# Patient Record
Sex: Male | Born: 1960 | Race: White | Hispanic: No | Marital: Married | Smoking: Never smoker
Health system: Southern US, Community
[De-identification: ages and names within clinical notes are randomized; demographics above are authoritative.]

---

## 2015-09-29 ENCOUNTER — Encounter: Payer: Self-pay | Admitting: Emergency Medicine

## 2015-09-29 ENCOUNTER — Ambulatory Visit (INDEPENDENT_AMBULATORY_CARE_PROVIDER_SITE_OTHER): Payer: Self-pay

## 2015-09-29 ENCOUNTER — Ambulatory Visit
Admission: EM | Admit: 2015-09-29 | Discharge: 2015-09-29 | Disposition: A | Discharge status: Home / Self Care | Payer: Self-pay | Attending: Family Medicine | Admitting: Family Medicine

## 2015-09-29 DIAGNOSIS — S93402A Sprain of unspecified ligament of left ankle, initial encounter: Secondary | ICD-10-CM

## 2015-09-29 NOTE — Discharge Instructions (Signed)
Follow up with orthopedic as discussed as needed for continued pain. Return to Urgent care for new or worsening concerns.   Ankle Sprain An ankle sprain is an injury to the strong, fibrous tissues (ligaments) that hold the bones of your ankle joint together.  CAUSES An ankle sprain is usually caused by a fall or by twisting your ankle. Ankle sprains most commonly occur when you step on the outer edge of your foot, and your ankle turns inward. People who participate in sports are more prone to these types of injuries.  SYMPTOMS   Pain in your ankle. The pain may be present at rest or only when you are trying to stand or walk.  Swelling.  Bruising. Bruising may develop immediately or within 1 to 2 days after your injury.  Difficulty standing or walking, particularly when turning corners or changing directions. DIAGNOSIS  Your caregiver will ask you details about your injury and perform a physical exam of your ankle to determine if you have an ankle sprain. During the physical exam, your caregiver will press on and apply pressure to specific areas of your foot and ankle. Your caregiver will try to move your ankle in certain ways. An X-ray exam may be done to be sure a bone was not broken or a ligament did not separate from one of the bones in your ankle (avulsion fracture).  TREATMENT  Certain types of braces can help stabilize your ankle. Your caregiver can make a recommendation for this. Your caregiver may recommend the use of medicine for pain. If your sprain is severe, your caregiver may refer you to a surgeon who helps to restore function to parts of your skeletal system (orthopedist) or a physical therapist. HOME CARE INSTRUCTIONS   Apply ice to your injury for 1-2 days or as directed by your caregiver. Applying ice helps to reduce inflammation and pain.  Put ice in a plastic bag.  Place a towel between your skin and the bag.  Leave the ice on for 15-20 minutes at a time, every 2 hours  while you are awake.  Only take over-the-counter or prescription medicines for pain, discomfort, or fever as directed by your caregiver.  Elevate your injured ankle above the level of your heart as much as possible for 2-3 days.  If your caregiver recommends crutches, use them as instructed. Gradually put weight on the affected ankle. Continue to use crutches or a cane until you can walk without feeling pain in your ankle.  If you have a plaster splint, wear the splint as directed by your caregiver. Do not rest it on anything harder than a pillow for the first 24 hours. Do not put weight on it. Do not get it wet. You may take it off to take a shower or bath.  You may have been given an elastic bandage to wear around your ankle to provide support. If the elastic bandage is too tight (you have numbness or tingling in your foot or your foot becomes cold and blue), adjust the bandage to make it comfortable.  If you have an air splint, you may blow more air into it or let air out to make it more comfortable. You may take your splint off at night and before taking a shower or bath. Wiggle your toes in the splint several times per day to decrease swelling. SEEK MEDICAL CARE IF:   You have rapidly increasing bruising or swelling.  Your toes feel extremely cold or you lose feeling in  your foot.  Your pain is not relieved with medicine. SEEK IMMEDIATE MEDICAL CARE IF:  Your toes are numb or blue.  You have severe pain that is increasing. MAKE SURE YOU:   Understand these instructions.  Will watch your condition.  Will get help right away if you are not doing well or get worse.   This information is not intended to replace advice given to you by your health care provider. Make sure you discuss any questions you have with your health care provider.   Document Released: 10/02/2005 Document Revised: 10/23/2014 Document Reviewed: 10/14/2011 Elsevier Interactive Patient Education [employer].

## 2015-09-29 NOTE — ED Provider Notes (Signed)
[facility]  ____________________________________________  Time seen: Approximately 8:54 PM  I have reviewed the triage vital signs and the nursing notes.   HISTORY  Chief Complaint Ankle Pain   HPI Frank Floyd is a 54 y.o. male  presents for the complaint of left lateral ankle pain times the last 6 days. Patient reports that 6 days ago he was jogging and stepped off the side of the road causing him to roll his left ankle. Patient reports that he was able to catch himself and avoid other injuries. Denies head injury or loss consciousness. Reports continued left ankle pain since. States current left ankle pain is much improved than initial pain after injury. States however continues with some swelling and bruising with intermittent pain. States pain is only when directly pressed or when rolling ankle. Denies pain with walking normally.  States current left ankle pain is 3 out of 10. Denies pain radiation. Denies other pain or injury. He does report that he had a left fibula fracture as a child. Denies need for pain medication.   History reviewed. No pertinent past medical history.  There are no active problems to display for this patient.   History reviewed. No pertinent past surgical history.  No current outpatient prescriptions on file.  Allergies Review of patient's allergies indicates no known allergies.  History reviewed. No pertinent family history.  Social History Social History  Substance Use Topics  . Smoking status: Never Smoker   . Smokeless tobacco: None  . Alcohol Use: Yes    Review of Systems Constitutional: No fever/chills Eyes: No visual changes. ENT: No sore throat. Cardiovascular: Denies chest pain. Respiratory: Denies shortness of breath. Gastrointestinal: No abdominal pain.  No nausea, no vomiting.  No diarrhea.  No constipation. Genitourinary: Negative for dysuria. Musculoskeletal: Negative for back pain. positive left ankle pain   Skin: Negative for rash. Neurological: Negative for headaches, focal weakness or numbness.  10-point ROS otherwise negative.  ____________________________________________   PHYSICAL EXAM:  VITAL SIGNS: ED Triage Vitals  Enc Vitals Group     BP 09/29/15 1953 115/74 mmHg     Pulse Rate 09/29/15 1953 71     Resp -- 18     Temp 09/29/15 1953 97.9 F (36.6 C)     Temp Source 09/29/15 1953 Oral     SpO2 09/29/15 1953 99 %     Weight 09/29/15 1953 162 lb (73.483 kg)     Height 09/29/15 1953  (1.778 m)     Head Cir --      Peak Flow --      Pain Score 09/29/15 1955 6     Pain Loc --      Pain Edu? --      Excl. in GC? --     Constitutional: Alert and oriented. Well appearing and in no acute distress. Eyes: Conjunctivae are normal. PERRL. EOMI. Head: Atraumatic.  Nose: No congestion/rhinnorhea.  Mouth/Throat: Mucous membranes are moist Neck: No stridor.  No cervical spine tenderness to palpation. Cardiovascular: Normal rate, regular rhythm. Grossly normal heart sounds.  Good peripheral circulation. Respiratory: Normal respiratory effort.  No retractions. Lungs CTAB. Musculoskeletal: No lower or upper extremity tenderness nor edema. Bilateral pedal pulses equal and easily palpated.  Except:  mild left lateral malleolus and mild anterior left ankle tenderness to palpation, minimal swelling, mild lower lateral foot ecchymosis that is nontender to palpation,  full range of motion present, minimal pain with ankle rotation, full range of motion present. Left  foot nontender. Left lower extremity otherwise nontender. No pain with dorsiflexion or plantarflexion. No calf tenderness bilaterally. Steady gait.     Neurologic:  Normal speech and language. No gross focal neurologic deficits are appreciated. No gait instability. Skin:  Skin is warm, dry and intact. No rash noted. Psychiatric: Mood and affect are normal. Speech and behavior are normal.   ____________________________________________   LABS (all labs ordered are listed, but only abnormal results are displayed)  Labs Reviewed - No data to display  RADIOLOGY  EXAM: LEFT ANKLE COMPLETE - 3+ VIEW  COMPARISON: None.  FINDINGS: Frontal, oblique, and lateral views were obtained. There is evidence of old trauma involving the distal fibular region with benign-appearing periosteal reaction along the distal fibula consistent with remodeling. There is no acute fracture or joint effusion. The ankle mortise appears intact. There is no appreciable joint space narrowing.  IMPRESSION: Old trauma distal fibula with bony remodeling in this area. No acute fracture. Mortise intact.   Electronically Signed By: Bretta BangWilliam Woodruff III M.D. On: 09/29/2015 20:16          DG Foot Complete Left (Final result) Result time: 09/29/15 20:16:56   Final result by Rad Results In Interface (09/29/15 20:16:56)   Narrative:   CLINICAL DATA: Initial encounter for Pt was running 6 days ago and stumbled lost balance and fell injuring left ankle and foot. Most pain with bruising along the base of 4th and 5th MT bone. bruise also lower fibula area just above and ant to lat malleolus. Old frac.*comment was truncated*  EXAM: LEFT FOOT - COMPLETE 3+ VIEW  COMPARISON: None  FINDINGS: No acute fracture or dislocation. Remote posttraumatic deformity involving the distal fibula.  IMPRESSION: No acute osseous abnormality.   Electronically Signed By: Jeronimo GreavesKyle Talbot M.D. On: 09/29/2015 20:16      I, Renford DillsLindsey Manjot Beumer, personally viewed and evaluated these images (plain radiographs) as part of my medical decision making.   ____________________________________________   PROCEDURES  Procedure(s) performed:  Denies need for/ refused crutches or splint.   INITIAL IMPRESSION / ASSESSMENT AND PLAN / ED COURSE  Pertinent labs & imaging results that were available during my  care of the patient were reviewed by me and considered in my medical decision making (see chart for details).   Very well-appearing patient. No acute distress. Ambulatory in room. Presents for complaints of left lateral ankle pain 6 days post after accidentally rolling ankle while jogging. Denies head injury or loss of consciousness. Denies other injury. Reports has remained ambulatory. States ankle pain overall has improved over the last 6 days however since it still has some pain he wanted to evaluate and to rule out fracture.  Left ankle and left foot x-rays reviewed. Per radiology no acute osseous abnormality, old trauma distal fibula with bony remodeling. Full range of motion present. Suspect strain injury. Counseled regarding rest, ice. Patient denies need for crutches or splint. Patient denies need for pain medications. Information for orthopedic Dr. Joice LoftsPoggi given, for patient to follow-up as needed for continued pain.   Discussed follow up with Primary care physician this week. Discussed follow up and return parameters including no resolution or any worsening concerns. Patient verbalized understanding and agreed to plan.   ____________________________________________   FINAL CLINICAL IMPRESSION(S) / ED DIAGNOSES  Final diagnoses:  Left ankle sprain, initial encounter       Renford DillsLindsey Elizah Lydon, NP 09/29/15 2110

## 2015-09-29 NOTE — ED Notes (Signed)
Pt was jogging and twisted left ankle and fell, six days ago. Pt able to ambulate, bruises to ankle.  Concerned for fracture.

## 2019-09-17 ENCOUNTER — Other Ambulatory Visit: Payer: Self-pay | Admitting: Family Medicine

## 2019-09-17 ENCOUNTER — Ambulatory Visit
Admission: RE | Admit: 2019-09-17 | Discharge: 2019-09-17 | Disposition: A | Discharge status: Home / Self Care | Payer: Managed Care, Other (non HMO) | Attending: Family Medicine | Admitting: Family Medicine

## 2019-09-17 ENCOUNTER — Ambulatory Visit
Admission: RE | Admit: 2019-09-17 | Discharge: 2019-09-17 | Disposition: A | Discharge status: Home / Self Care | Payer: Managed Care, Other (non HMO) | Source: Ambulatory Visit | Attending: Family Medicine | Admitting: Family Medicine

## 2019-09-17 ENCOUNTER — Other Ambulatory Visit: Payer: Self-pay

## 2019-09-17 DIAGNOSIS — M25521 Pain in right elbow: Secondary | ICD-10-CM

## 2019-09-17 DIAGNOSIS — G8929 Other chronic pain: Secondary | ICD-10-CM

## 2019-11-26 ENCOUNTER — Other Ambulatory Visit: Payer: Self-pay | Admitting: Neurosurgery

## 2019-11-26 DIAGNOSIS — M4324 Fusion of spine, thoracic region: Secondary | ICD-10-CM

## 2019-12-30 ENCOUNTER — Ambulatory Visit: Payer: Managed Care, Other (non HMO)

## 2020-12-01 ENCOUNTER — Other Ambulatory Visit: Payer: Self-pay | Admitting: Neurosurgery

## 2020-12-01 ENCOUNTER — Other Ambulatory Visit (HOSPITAL_COMMUNITY): Payer: Self-pay | Admitting: Neurosurgery

## 2020-12-01 DIAGNOSIS — M4324 Fusion of spine, thoracic region: Secondary | ICD-10-CM

## 2021-07-10 IMAGING — CR DG ELBOW 2V*R*
2 series · 2 of 2 positions shown · non-contrast
Comparison: None.

CLINICAL DATA: Chronic right elbow pain medial side 1 year. No
injury.

EXAM:
RIGHT ELBOW - 2 VIEW

[elbow ap]
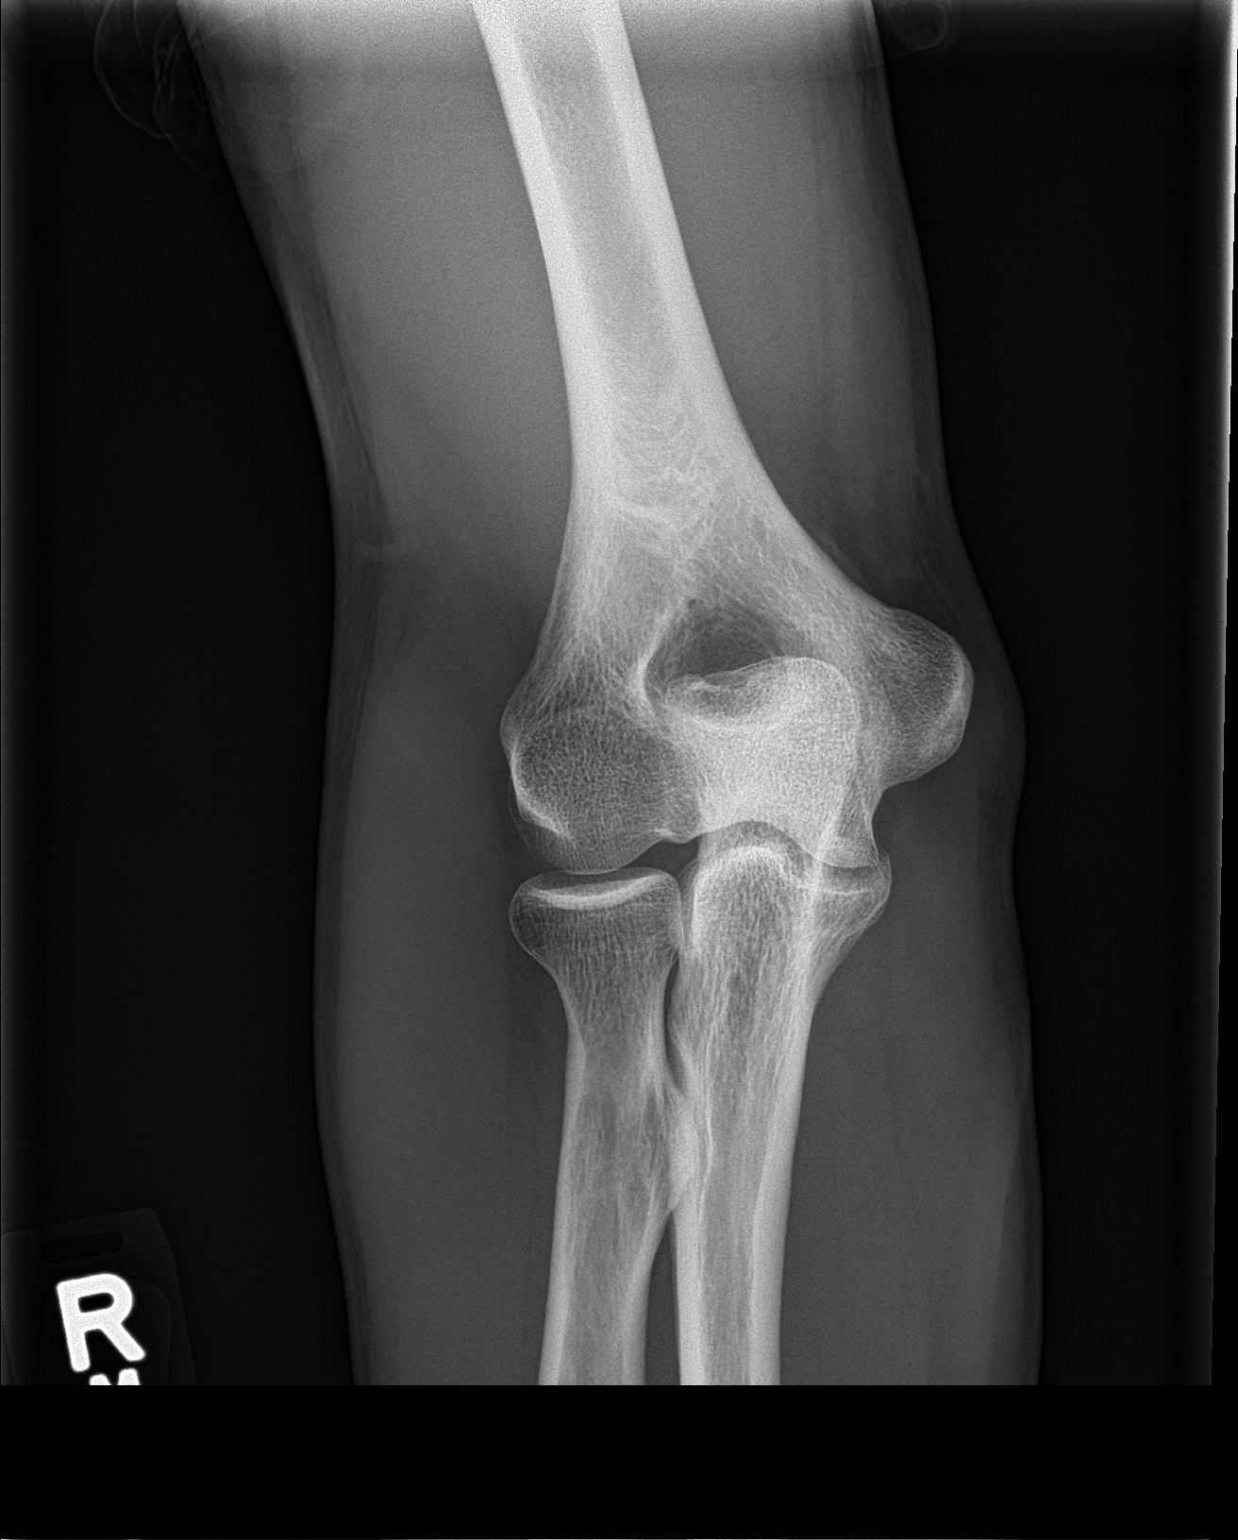

[elbow lat]
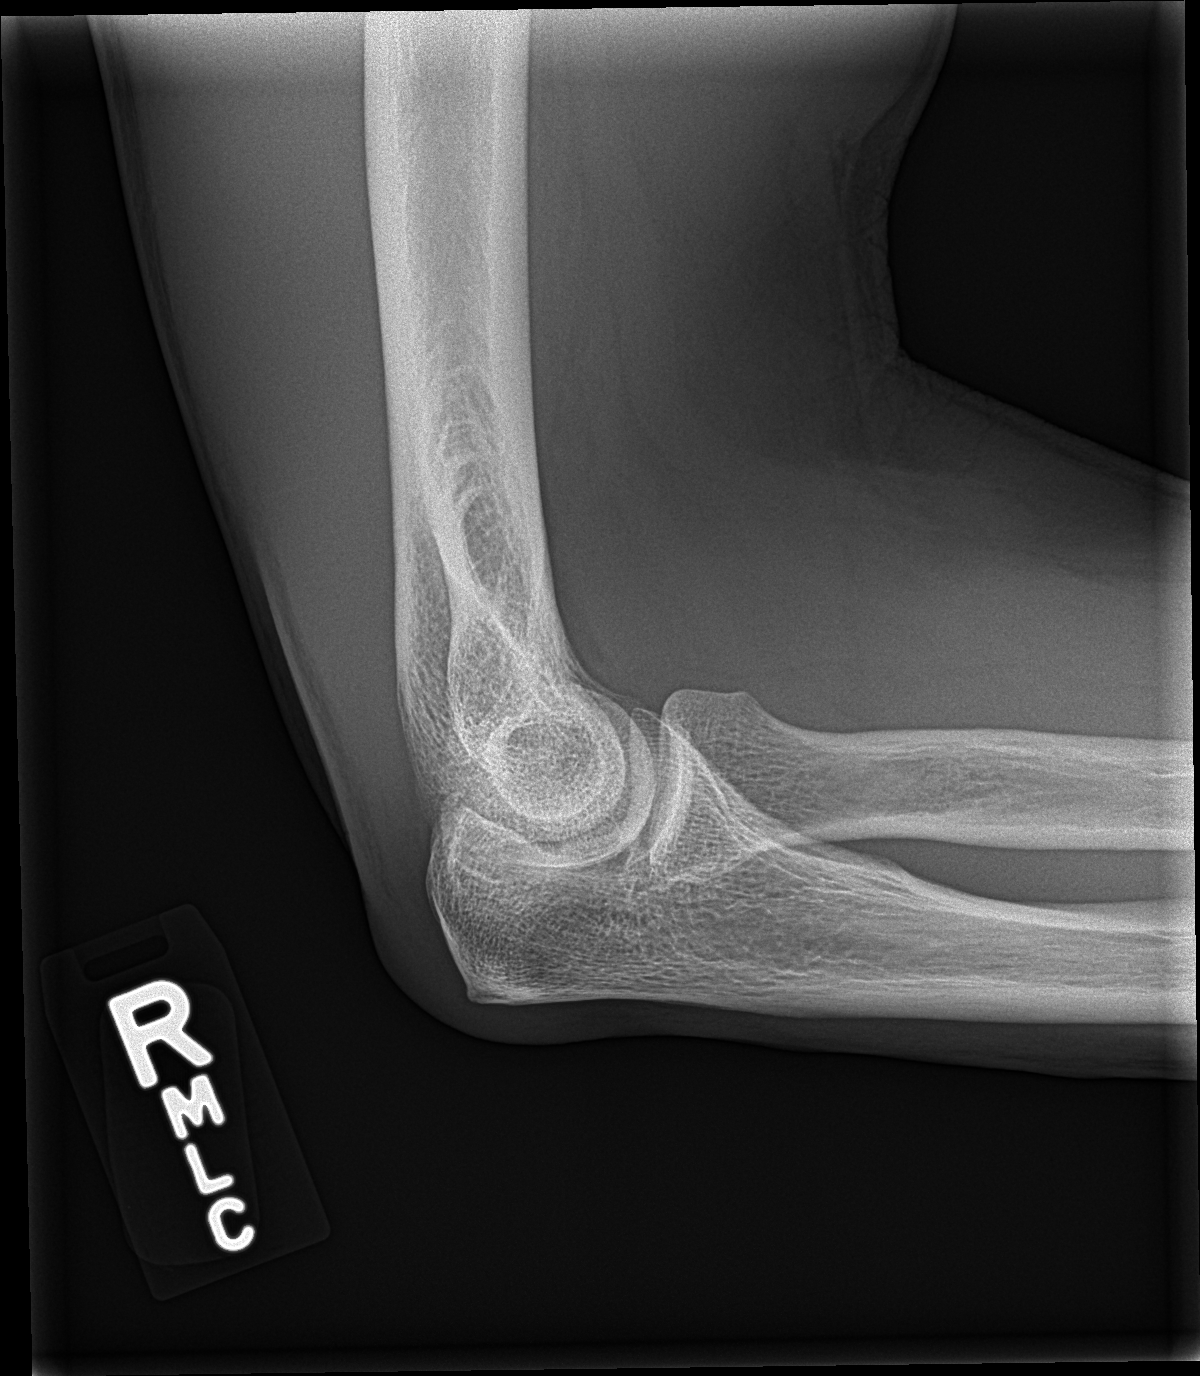

[2 of 2 positions shown; findings below may reference images not displayed]

FINDINGS: There is no evidence of fracture, dislocation, or joint effusion.
There is no evidence of arthropathy or other focal bone abnormality.
Soft tissues are unremarkable.
IMPRESSION: Negative.

## 2022-05-03 ENCOUNTER — Other Ambulatory Visit: Payer: Self-pay

## 2022-05-03 DIAGNOSIS — Z9689 Presence of other specified functional implants: Secondary | ICD-10-CM

## 2022-05-04 ENCOUNTER — Ambulatory Visit
Admission: RE | Admit: 2022-05-04 | Discharge: 2022-05-04 | Disposition: A | Discharge status: Home / Self Care | Payer: BC Managed Care – PPO | Source: Ambulatory Visit | Attending: Neurosurgery | Admitting: Neurosurgery

## 2022-05-04 ENCOUNTER — Ambulatory Visit
Admission: RE | Admit: 2022-05-04 | Discharge: 2022-05-04 | Disposition: A | Discharge status: Home / Self Care | Payer: BC Managed Care – PPO | Attending: Neurosurgery | Admitting: Neurosurgery

## 2022-05-04 ENCOUNTER — Ambulatory Visit: Payer: BC Managed Care – PPO | Admitting: Neurosurgery

## 2022-05-04 VITALS — BP 120/78 | Ht 69.0 in | Wt 159.6 lb

## 2022-05-04 DIAGNOSIS — Z9689 Presence of other specified functional implants: Secondary | ICD-10-CM | POA: Diagnosis present

## 2022-05-04 DIAGNOSIS — Z981 Arthrodesis status: Secondary | ICD-10-CM

## 2022-05-04 DIAGNOSIS — Z4589 Encounter for adjustment and management of other implanted devices: Secondary | ICD-10-CM | POA: Insufficient documentation

## 2022-05-04 NOTE — Progress Notes (Signed)
   DOS: 03/31/2019 (T3-9 PSF)  HISTORY OF PRESENT ILLNESS: 05/04/2022 [name] is status post thoracic fusion 3 years ago.  He is concerned about the presence of his incision in an area of the spine that is poking out.Marland Kitchen   PHYSICAL EXAMINATION:   Vitals:   05/04/22 1605  BP: 120/78   General: Patient is well developed, well nourished, calm, collected, and in no apparent distress.  NEUROLOGICAL:  General: In no acute distress.  Awake, alert, oriented to person, place, and time. Pupils equal round and reactive to light.  He is able to walk without issue.  He appears to have full strength.  He has regained most of his range of motion, though he has some limitation on rotation to the right.     Incision c/d/i inspection of his incision shows an area in the midline that is prominent.  This appears to be a spinous process around T5.  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  I think the area of prominence he feels is a spinous process.  I am not concerned about the appearance.  It does not appear to be irritating the skin and there is no evidence of skin breakdown.  I have recommended that he monitor this.  I will see him back on an as-needed basis.  I spent a total of 10 minutes in face-to-face and non-face-to-face activities related to this patient's care today.   Venetia Night MD, [facility] Department of Neurosurgery
